# Patient Record
Sex: Female | Born: 2005 | Race: White | Hispanic: No | Marital: Single | Smoking: Never smoker
Health system: Southern US, Community
[De-identification: ages and names within clinical notes are randomized; demographics above are authoritative.]

---
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
## 2024-03-06 ENCOUNTER — Institutional Professional Consult (permissible substitution): Admitting: Plastic Surgery

## 2024-03-09 ENCOUNTER — Ambulatory Visit: Admitting: Plastic Surgery

## 2024-03-09 ENCOUNTER — Encounter: Payer: Self-pay | Admitting: Plastic Surgery

## 2024-03-09 VITALS — BP 137/78 | HR 74 | Ht 64.0 in | Wt 206.2 lb

## 2024-03-09 DIAGNOSIS — N62 Hypertrophy of breast: Secondary | ICD-10-CM

## 2024-03-09 DIAGNOSIS — M549 Dorsalgia, unspecified: Secondary | ICD-10-CM

## 2024-03-09 DIAGNOSIS — M542 Cervicalgia: Secondary | ICD-10-CM

## 2024-03-09 NOTE — Progress Notes (Signed)
 Referring Provider Kline, Chianne, PA-C [id number] [place],  [place] [address]   CC:  Chief Complaint  Patient presents with   Consult      Cheyenne Holt is an 18 y.o. female.  HPI: Cheyenne Holt is an 18 year old female who presents today with complaints of upper back and neck pain approximately 1 year duration.  Patient states that she has had back pain in the past and is not sure whether this is intrinsic to the spine or due to the large size of her breast.  She is interested in finding out what can be done as far as a breast reduction.  She has no other medical issues and moves breast issues.  No Known Allergies  Outpatient Encounter Medications as of 03/09/2024  Medication Sig   HAILEY FE 1/20 1-20 MG-MCG tablet Take 1 tablet by mouth daily.   VENTOLIN  HFA 108 (90 Base) MCG/ACT inhaler SMARTSIG:1 Puff(s) By Mouth Every 6 Hours PRN   [DISCONTINUED] fluticasone  (FLONASE ) 50 MCG/ACT nasal spray Place 1 spray into both nostrils daily. (Patient not taking: Reported on 03/09/2024)   [DISCONTINUED] ipratropium (ATROVENT ) 0.06 % nasal spray Place 2 sprays into both nostrils 3 (three) times daily. (Patient not taking: Reported on 03/09/2024)   [DISCONTINUED] phenazopyridine  (PYRIDIUM ) 100 MG tablet Take 1 tablet (100 mg total) by mouth 3 (three) times daily as needed (urinary pain). (Patient not taking: Reported on 03/09/2024)   No facility-administered encounter medications on file as of 03/09/2024.     No past medical history on file.  No past surgical history on file.  Family History  Problem Relation Age of Onset   Asthma Mother    Bipolar disorder Mother    Anxiety disorder Mother    Bipolar disorder Father    Seizures Father    Asthma Father     Social History   Social History Narrative   Lives with father and two siblings.  Parents smoke outside home.  Stays at home with dad.      Review of Systems General: Denies fevers, chills, weight loss CV: Denies  chest pain, shortness of breath, palpitations Breast: Large breasts which she feels may be contributing to her back pain. Physical Exam    03/09/2024    2:36 PM 11/10/2022    2:04 PM 08/17/2018    3:13 PM  Vitals with BMI  Height 5' 4    Weight 206 lbs 3 oz 190 lbs 13 oz 153 lbs 13 oz  BMI 35.38    Systolic 137  125  Diastolic 78  78  Pulse 74 75 122    General:  No acute distress,  Alert and oriented, Non-Toxic, Normal speech and affect Breast: Patient has very large breasts with breast asymmetry the left heavier than the right.  There are no dominant masses on physical examination and the nipples are normal in appearance.  She has grade 3 ptosis bilaterally. 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  I can remove 600 g from the right and 700 g from the left.  We had a long discussion about breast reductions including the location of the incisions and the unpredictable nature of scarring and wound healing.  We discussed the risks of bleeding, infection, and seroma formation.  She understands that if she proceeds with a breast reduction I will use drains.  We discussed the possibility of nipple loss due to  nipple ischemia.  We also discussed the possibility that she may not be able to successfully breast-feed after breast reduction.  Postoperatively her limitations will include no heavy lifting greater than 20 pounds, no vigorous activity, no submerging incisions in water for 6 weeks.  She will need to wear a supportive compressive garment for 6 weeks.  She may return to light activity as tolerated and she will be encouraged to begin ambulation immediately after surgery to help prevent DVT.  All questions were answered to her satisfaction.  She is still not sure that she wants to undergo a breast reduction at this time I feel this is an appropriate choice.  I have asked her to  go home and think about it and she may return at any point to schedule if she chooses to move forward with a breast reduction.  I spent 30 minutes reviewing chart, examining the patient, discussing the procedure, and documenting.  Leonce KATHEE [place] 03/09/2024, 3:02 PM
